# Patient Record
Sex: Female | Born: 1980 | Race: Asian | Hispanic: No | Marital: Married | Smoking: Never smoker
Health system: Southern US, Community
[De-identification: ages and names within clinical notes are randomized; demographics above are authoritative.]

## PROBLEM LIST (undated history)

## (undated) DIAGNOSIS — T7840XA Allergy, unspecified, initial encounter: Secondary | ICD-10-CM

## (undated) HISTORY — DX: Allergy, unspecified, initial encounter: T78.40XA

---

## 2012-01-27 LAB — OB RESULTS CONSOLE RPR: RPR: NONREACTIVE

## 2012-01-27 LAB — OB RESULTS CONSOLE ABO/RH: RH Type: POSITIVE

## 2012-01-27 LAB — OB RESULTS CONSOLE HIV ANTIBODY (ROUTINE TESTING): HIV: NONREACTIVE

## 2012-01-27 LAB — OB RESULTS CONSOLE HEPATITIS B SURFACE ANTIGEN: Hepatitis B Surface Ag: NEGATIVE

## 2012-02-18 NOTE — L&D Delivery Note (Signed)
Operative Delivery Note At 4:23 PM a viable and healthy female was delivered via Vaginal, Vacuum Investment banker, operational).  Presentation: vertex; Position: Occiput,, Anterior; Station: +3.  Verbal consent: obtained from patient.  Risks and benefits discussed in detail.  Risks include, but are not limited to the risks of anesthesia, bleeding, infection, damage to maternal tissues, fetal cephalhematoma.  There is also the risk of inability to effect vaginal delivery of the head, or shoulder dystocia that cannot be resolved by established maneuvers, leading to the need for emergency cesarean section.due to repetitive deep variable decel  APGAR: 7, 9; weight .  6lb Placenta status: Intact, Spontaneous. Not sent  Cord: 3 vessels with the following complications: Short.  Cord pH: none  Anesthesia: Epidural  Instruments: mushroom vacuum Episiotomy: None Lacerations: 2nd degree;Perineal Suture Repair: 3.0 chromic Est. Blood Loss (mL): 250  Mom to postpartum.  Baby to nursery-stable.  Yasaman Kolek A 08/31/2012, 5:04 PM

## 2012-04-19 ENCOUNTER — Ambulatory Visit (INDEPENDENT_AMBULATORY_CARE_PROVIDER_SITE_OTHER): Payer: 59 | Admitting: Internal Medicine

## 2012-04-19 VITALS — BP 112/72 | HR 81 | Temp 99.3°F | Resp 16 | Ht 60.5 in | Wt 131.0 lb

## 2012-04-19 DIAGNOSIS — L309 Dermatitis, unspecified: Secondary | ICD-10-CM

## 2012-04-19 DIAGNOSIS — L259 Unspecified contact dermatitis, unspecified cause: Secondary | ICD-10-CM

## 2012-04-19 MED ORDER — FLUOCINONIDE-E 0.05 % EX CREA: TOPICAL_CREAM | Freq: Two times a day (BID) | CUTANEOUS | Status: DC

## 2012-04-19 NOTE — Progress Notes (Signed)
  Subjective:    Patient ID: Barbara Campbell, female    DOB: [birth year], 32 y.o.   MRN: [id number]  HPIrash hands and feet 3 weeks--comes out at night and itches No illn/no meds 5 mos preg--ob says not connected No hx skin probs New house 2-3 months     Review of Systems     Objective:   Physical Exam BP 112/72  Pulse 81  Temp(Src) 99.3 F (37.4 C) (Oral)  Resp 16  Ht 5' 0.5" (1.537 m)  Wt 131 lb (59.421 kg)  BMI 25.15 kg/m2  SpO2 98% NAD Skin with tiny papules on dorsum of feet and a little on dorsum of hands w/out webspace involvement No scales      Assessment & Plan:  Dry skin eczema Lotion Lidex 2 weeks then reck if not well

## 2012-05-22 ENCOUNTER — Ambulatory Visit (INDEPENDENT_AMBULATORY_CARE_PROVIDER_SITE_OTHER): Payer: 59 | Admitting: Internal Medicine

## 2012-05-22 VITALS — BP 102/70 | HR 86 | Temp 98.0°F | Resp 16 | Ht 60.0 in | Wt 130.0 lb

## 2012-05-22 DIAGNOSIS — J309 Allergic rhinitis, unspecified: Secondary | ICD-10-CM | POA: Insufficient documentation

## 2012-05-22 DIAGNOSIS — R05 Cough: Secondary | ICD-10-CM

## 2012-05-22 DIAGNOSIS — R059 Cough, unspecified: Secondary | ICD-10-CM

## 2012-05-22 MED ORDER — FLUTICASONE PROPIONATE 50 MCG/ACT NA SUSP: NASAL | Status: DC

## 2012-05-22 MED ORDER — PREDNISONE 20 MG PO TABS: ORAL_TABLET | ORAL | Status: DC

## 2012-05-22 NOTE — Progress Notes (Signed)
  Subjective:    Patient ID: Barbara Campbell, female    DOB: 13-Oct-1980, 32 y.o.   MRN: [id number]  HPI treated last week for a cough at another urgent care Center Was given amoxicillin and Hycodan but is not better Increased symptoms beginning Tuesday of discharge from eyes with irritation, increased runny nose, Cough, and wheezing at night. Mild increase in eczema which responds to Lidex-note past visit  6 months pregnant  Review of Systems No fever/cough nonproductive     Objective:   Physical Exam vital signs stable Injected conjunctiva Boggy nares with clear rhinorrhea No nodes Wheezing with forced expiration mild Lungs clear otherwise      Assessment & Plan:  AR (allergic rhinitis)  Cough secondary to reactive airway  Meds ordered this encounter  Medications  . fluticasone (FLONASE) 50 MCG/ACT nasal spray    Sig: 2 sprays in each nostril twice a day for 7 days and then 2 sprays in each nostril at bedtime for 2-3 months    Dispense:  16 g    Refill:  6  . predniSONE (DELTASONE) 20 MG tablet    Sig: 3/3/2/2/1/1 single daily dose for 6 days    Dispense:  12 tablet    Refill:  0  ----zyrtec is on her approved list from gynecology obstetrics

## 2012-05-24 ENCOUNTER — Emergency Department (HOSPITAL_COMMUNITY)
Admission: EM | Admit: 2012-05-24 | Discharge: 2012-05-24 | Disposition: A | Discharge status: Home / Self Care | Payer: Commercial Managed Care - PPO | Attending: Emergency Medicine | Admitting: Emergency Medicine

## 2012-05-24 ENCOUNTER — Encounter (HOSPITAL_COMMUNITY): Payer: Self-pay | Admitting: Emergency Medicine

## 2012-05-24 ENCOUNTER — Emergency Department (HOSPITAL_COMMUNITY): Payer: Commercial Managed Care - PPO

## 2012-05-24 DIAGNOSIS — IMO0002 Reserved for concepts with insufficient information to code with codable children: Secondary | ICD-10-CM | POA: Insufficient documentation

## 2012-05-24 DIAGNOSIS — N23 Unspecified renal colic: Secondary | ICD-10-CM

## 2012-05-24 DIAGNOSIS — R109 Unspecified abdominal pain: Secondary | ICD-10-CM | POA: Insufficient documentation

## 2012-05-24 DIAGNOSIS — R110 Nausea: Secondary | ICD-10-CM | POA: Insufficient documentation

## 2012-05-24 DIAGNOSIS — O9989 Other specified diseases and conditions complicating pregnancy, childbirth and the puerperium: Secondary | ICD-10-CM | POA: Insufficient documentation

## 2012-05-24 LAB — URINE MICROSCOPIC-ADD ON

## 2012-05-24 LAB — URINALYSIS, ROUTINE W REFLEX MICROSCOPIC
Glucose, UA: NEGATIVE mg/dL
Ketones, ur: NEGATIVE mg/dL
Protein, ur: NEGATIVE mg/dL
Urobilinogen, UA: 0.2 mg/dL (ref 0.0–1.0)

## 2012-05-24 MED ORDER — ONDANSETRON HCL 4 MG PO TABS: 4.0000 mg | ORAL_TABLET | Freq: Four times a day (QID) | ORAL | Status: DC

## 2012-05-24 MED ORDER — OXYCODONE-ACETAMINOPHEN 5-325 MG PO TABS: 1.0000 | ORAL_TABLET | Freq: Four times a day (QID) | ORAL | Status: DC | PRN

## 2012-05-24 MED ORDER — ONDANSETRON HCL 4 MG/2ML IJ SOLN
4.0000 mg | Freq: Once | INTRAMUSCULAR | Status: AC
  Administered 2012-05-24: 4 mg via INTRAVENOUS
  Filled 2012-05-24: qty 2

## 2012-05-24 MED ORDER — HYDROMORPHONE HCL PF 1 MG/ML IJ SOLN
1.0000 mg | Freq: Once | INTRAMUSCULAR | Status: AC
  Administered 2012-05-24: 0.5 mg via INTRAVENOUS
  Filled 2012-05-24: qty 1

## 2012-05-24 MED ORDER — SODIUM CHLORIDE 0.9 % IV SOLN
INTRAVENOUS | Status: DC
  Administered 2012-05-24: 05:00:00 via INTRAVENOUS

## 2012-05-24 NOTE — ED Provider Notes (Signed)
History     CSN: [id number]  Arrival date & time 05/24/12  0401   First MD Initiated Contact with Patient 05/24/12 [phone]      Chief Complaint  Patient presents with  . Flank Pain    (Consider location/radiation/quality/duration/timing/severity/associated sxs/prior treatment) HPI History provided by patient. Right flank pain onset around 1:30 this morning with associated nausea. Pain initially crampy now sharp and more severe. Started to radiate to right lower quadrant area. She has not noticed any blood or urine. She is currently [redacted] weeks pregnant G3 P0. She is followed by Dr. Cherly Hensen. No vaginal bleeding or LOF. No fevers. No dysuria. No history of kidney stones or similar symptoms in the past. No known alleviating factors for persistent pain. Past Medical History  Diagnosis Date  . Allergy     No past surgical history on file.  Family History  Problem Relation Age of Onset  . Hyperlipidemia Mother   . Anemia Mother   . Hypertension Father     History  Substance Use Topics  . Smoking status: Never Smoker   . Smokeless tobacco: Not on file  . Alcohol Use: No    OB History   Grav Para Term Preterm Abortions TAB SAB Ect Mult Living   2         0      Review of Systems  Constitutional: Negative for fever and chills.  HENT: Negative for neck pain and neck stiffness.   Eyes: Negative for pain.  Respiratory: Negative for shortness of breath.   Cardiovascular: Negative for chest pain.  Gastrointestinal: Negative for abdominal pain.  Genitourinary: Positive for flank pain. Negative for dysuria.  Musculoskeletal: Negative for back pain.  Skin: Negative for rash.  Neurological: Negative for headaches.  All other systems reviewed and are negative.    Allergies  Sulfa antibiotics  Home Medications   Current Outpatient Rx  Name  Route  Sig  Dispense  Refill  . fluocinonide-emollient (LIDEX-E) 0.05 % cream   Topical   Apply topically 2 (two) times daily.   30 g  0   . fluticasone (FLONASE) 50 MCG/ACT nasal spray      2 sprays in each nostril twice a day for 7 days and then 2 sprays in each nostril at bedtime for 2-3 months   16 g   6   . ondansetron (ZOFRAN) 8 MG tablet   Oral   Take by mouth every 12 (twelve) hours as needed for nausea.         . predniSONE (DELTASONE) 20 MG tablet      3/3/2/2/1/1 single daily dose for 6 days   12 tablet   0   . Prenatal Vit-Fe Fumarate-FA (PRENATAL MULTIVITAMIN) TABS   Oral   Take 1 tablet by mouth daily at 12 noon.           BP 147/98  Pulse 82  Temp(Src) 97.7 F (36.5 C) (Oral)  Resp 18  Ht 4\' 11"  (1.499 m)  Wt 130 lb 6.4 oz (59.149 kg)  BMI 26.32 kg/m2  SpO2 97%  Physical Exam  Constitutional: She is oriented to person, place, and time. She appears well-developed and well-nourished.  HENT:  Head: Normocephalic and atraumatic.  Mouth/Throat: Oropharynx is clear and moist.  Eyes: EOM are normal. Pupils are equal, round, and reactive to light. No scleral icterus.  Neck: Neck supple.  Cardiovascular: Normal rate, regular rhythm and intact distal pulses.   Pulmonary/Chest: Effort normal and breath sounds normal.  No respiratory distress.  Abdominal:  Gravid consistent with dates. No abdominal tenderness, guarding or rebound. No CVA tenderness. Localizes discomfort to right flank. No tenderness over McBurney's point. Negative Murphy sign  Musculoskeletal: Normal range of motion. She exhibits no edema.  Neurological: She is alert and oriented to person, place, and time.  Skin: Skin is warm and dry.    ED Course  Procedures (including critical care time)  Labs Reviewed  URINALYSIS, ROUTINE W REFLEX MICROSCOPIC - Abnormal; Notable for the following:    Hgb urine dipstick LARGE (*)    All other components within normal limits  URINE MICROSCOPIC-ADD ON   US Abdomen Complete  05/24/2012  *RADIOLOGY REPORT*  Clinical Data:  Right flank pain.  ABDOMINAL ULTRASOUND COMPLETE  Comparison:   None  Findings:  Gallbladder:  The gallbladder is normal in appearance, without evidence for gallstones, significant gallbladder wall thickening or pericholecystic fluid.  No ultrasonographic Murphy's sign is elicited.  Common Bile Duct:  0.5 cm in diameter; within normal limits in caliber.  Liver:  Normal parenchymal echogenicity and echotexture; no focal lesions identified.  Limited Doppler evaluation demonstrates normal blood flow within the liver.  IVC:  Unremarkable in appearance.  Pancreas:  Although the pancreas is difficult to visualize in its entirety due to overlying bowel gas, no focal pancreatic abnormality is identified.  Spleen:  8.1 cm in length; within normal limits in size and echotexture.  Right kidney:  11.1 cm in length; normal in size, configuration and parenchymal echogenicity.  No evidence of mass.  There is prominence of the right renal pelvis, which may reflect mild pelvicaliectasis or slight hydronephrosis.  Left kidney:  11.0 cm in length; normal in size, configuration and parenchymal echogenicity.  No evidence of mass or hydronephrosis.  Abdominal Aorta:  Normal in caliber; no aneurysm identified.  Bilateral ureteral jets are noted at the bladder, slightly less prominent on the right.  IMPRESSION:  1.  Prominence of the right renal pelvis, which may reflect mild pelvicaliectasis or slight hydronephrosis.  Bilateral ureteral jets are visualized, though mildly less prominent on the right.  This could reflect slight hydronephrosis of pregnancy. 2.  Otherwise unremarkable abdominal ultrasound.   Original Report Authenticated By: Tonia Ghent, M.D.    IV Dilaudid and IV fluids. On recheck pain significantly improved. Patient is ambulating to the bathroom no acute distress.  Rapid response nurse present, fetal heart tones 150s with no contractions on fetal monitoring.   6:18 AM discussed with OB/GYN, Dr Billy Coast, on call, recs OK for narcotics and f/u DR Cherly Hensen.   MDM   Right flank  pain with hematuria and prominent right renal pelvis on ultrasound, suspect ureterolithiasis. Serial abdominal exams without tenderness or peritonitis and unlikely acute appendicitis  Reliable historian and agrees to strict return precautions       Sunnie Nielsen, MD 05/24/12 [phone]

## 2012-05-24 NOTE — ED Notes (Signed)
Pt c/o Right flank pain. Pt states pain started around 0130 this morning, patient has nausea.

## 2012-05-24 NOTE — Progress Notes (Signed)
Pt is a G2P0  (EDC 7/18) at 25wk 3da. 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  States she has had urinary frequency since 4/6 in the morning; with R flank pain starting 4/7 @ 0130. She denies leaking of fluid or vaginal bleeding.  FHT 150, minimal/moderate variability, w/10x10 accelerations and no decelerations.

## 2012-06-30 ENCOUNTER — Inpatient Hospital Stay (HOSPITAL_COMMUNITY): Admission: AD | Admit: 2012-06-30 | Payer: Self-pay | Source: Ambulatory Visit | Admitting: Obstetrics and Gynecology

## 2012-08-31 ENCOUNTER — Inpatient Hospital Stay (HOSPITAL_COMMUNITY)
Admission: AD | Admit: 2012-08-31 | Discharge: 2012-09-02 | DRG: 775 | Disposition: A | Discharge status: Home / Self Care | Payer: Commercial Managed Care - PPO | Source: Ambulatory Visit | Attending: Obstetrics and Gynecology | Admitting: Obstetrics and Gynecology

## 2012-08-31 ENCOUNTER — Encounter (HOSPITAL_COMMUNITY): Payer: Self-pay | Admitting: Anesthesiology

## 2012-08-31 ENCOUNTER — Inpatient Hospital Stay (HOSPITAL_COMMUNITY): Payer: Commercial Managed Care - PPO | Admitting: Anesthesiology

## 2012-08-31 ENCOUNTER — Encounter (HOSPITAL_COMMUNITY): Payer: Self-pay | Admitting: *Deleted

## 2012-08-31 DIAGNOSIS — J309 Allergic rhinitis, unspecified: Secondary | ICD-10-CM

## 2012-08-31 DIAGNOSIS — K5900 Constipation, unspecified: Secondary | ICD-10-CM | POA: Diagnosis not present

## 2012-08-31 DIAGNOSIS — O9903 Anemia complicating the puerperium: Secondary | ICD-10-CM | POA: Diagnosis not present

## 2012-08-31 DIAGNOSIS — D62 Acute posthemorrhagic anemia: Secondary | ICD-10-CM | POA: Diagnosis not present

## 2012-08-31 LAB — ABO/RH: ABO/RH(D): O POS

## 2012-08-31 LAB — CBC
MCH: 29 pg (ref 26.0–34.0)
MCV: 86.5 fL (ref 78.0–100.0)
Platelets: 192 10*3/uL (ref 150–400)
RDW: 15.3 % (ref 11.5–15.5)
WBC: 9.7 10*3/uL (ref 4.0–10.5)

## 2012-08-31 MED ORDER — OXYTOCIN 40 UNITS IN LACTATED RINGERS INFUSION - SIMPLE MED
62.5000 mL/h | INTRAVENOUS | Status: DC
  Administered 2012-08-31: 999 mL/h via INTRAVENOUS
  Filled 2012-08-31: qty 1000

## 2012-08-31 MED ORDER — EPHEDRINE 5 MG/ML INJ
10.0000 mg | INTRAVENOUS | Status: DC | PRN
  Filled 2012-08-31: qty 4

## 2012-08-31 MED ORDER — SENNOSIDES-DOCUSATE SODIUM 8.6-50 MG PO TABS
2.0000 | ORAL_TABLET | Freq: Every day | ORAL | Status: DC
  Administered 2012-08-31 – 2012-09-01 (×2): 2 via ORAL

## 2012-08-31 MED ORDER — IBUPROFEN 600 MG PO TABS: 600.0000 mg | ORAL_TABLET | Freq: Four times a day (QID) | ORAL | Status: DC | PRN

## 2012-08-31 MED ORDER — PHENYLEPHRINE 40 MCG/ML (10ML) SYRINGE FOR IV PUSH (FOR BLOOD PRESSURE SUPPORT): 80.0000 ug | PREFILLED_SYRINGE | INTRAVENOUS | Status: DC | PRN

## 2012-08-31 MED ORDER — EPHEDRINE 5 MG/ML INJ: 10.0000 mg | INTRAVENOUS | Status: DC | PRN

## 2012-08-31 MED ORDER — PHENYLEPHRINE 40 MCG/ML (10ML) SYRINGE FOR IV PUSH (FOR BLOOD PRESSURE SUPPORT)
80.0000 ug | PREFILLED_SYRINGE | INTRAVENOUS | Status: DC | PRN
  Filled 2012-08-31: qty 5

## 2012-08-31 MED ORDER — ONDANSETRON HCL 4 MG/2ML IJ SOLN: 4.0000 mg | Freq: Four times a day (QID) | INTRAMUSCULAR | Status: DC | PRN

## 2012-08-31 MED ORDER — LACTATED RINGERS IV SOLN: 500.0000 mL | INTRAVENOUS | Status: DC | PRN

## 2012-08-31 MED ORDER — LIDOCAINE HCL (PF) 1 % IJ SOLN
INTRAMUSCULAR | Status: DC | PRN
  Administered 2012-08-31 (×2): 9 mL

## 2012-08-31 MED ORDER — CITRIC ACID-SODIUM CITRATE 334-500 MG/5ML PO SOLN: 30.0000 mL | ORAL | Status: DC | PRN

## 2012-08-31 MED ORDER — TERBUTALINE SULFATE 1 MG/ML IJ SOLN: 0.2500 mg | Freq: Once | INTRAMUSCULAR | Status: DC | PRN

## 2012-08-31 MED ORDER — PRENATAL MULTIVITAMIN CH
1.0000 | ORAL_TABLET | Freq: Every day | ORAL | Status: DC
  Administered 2012-09-01 – 2012-09-02 (×2): 1 via ORAL
  Filled 2012-08-31 (×2): qty 1

## 2012-08-31 MED ORDER — OXYCODONE-ACETAMINOPHEN 5-325 MG PO TABS: 1.0000 | ORAL_TABLET | ORAL | Status: DC | PRN

## 2012-08-31 MED ORDER — WITCH HAZEL-GLYCERIN EX PADS
1.0000 "application " | MEDICATED_PAD | CUTANEOUS | Status: DC | PRN
  Administered 2012-09-01: 1 via TOPICAL

## 2012-08-31 MED ORDER — ONDANSETRON HCL 4 MG/2ML IJ SOLN: 4.0000 mg | INTRAMUSCULAR | Status: DC | PRN

## 2012-08-31 MED ORDER — BENZOCAINE-MENTHOL 20-0.5 % EX AERO
1.0000 "application " | INHALATION_SPRAY | CUTANEOUS | Status: DC | PRN
  Administered 2012-08-31: 1 via TOPICAL
  Filled 2012-08-31 (×2): qty 56

## 2012-08-31 MED ORDER — LANOLIN HYDROUS EX OINT: TOPICAL_OINTMENT | CUTANEOUS | Status: DC | PRN

## 2012-08-31 MED ORDER — LIDOCAINE HCL (PF) 1 % IJ SOLN
30.0000 mL | INTRAMUSCULAR | Status: AC | PRN
  Administered 2012-08-31: 30 mL via SUBCUTANEOUS
  Filled 2012-08-31 (×2): qty 30

## 2012-08-31 MED ORDER — SIMETHICONE 80 MG PO CHEW: 80.0000 mg | CHEWABLE_TABLET | ORAL | Status: DC | PRN

## 2012-08-31 MED ORDER — ONDANSETRON HCL 4 MG PO TABS: 4.0000 mg | ORAL_TABLET | ORAL | Status: DC | PRN

## 2012-08-31 MED ORDER — LACTATED RINGERS IV SOLN: 500.0000 mL | Freq: Once | INTRAVENOUS | Status: DC

## 2012-08-31 MED ORDER — ZOLPIDEM TARTRATE 5 MG PO TABS
5.0000 mg | ORAL_TABLET | Freq: Every evening | ORAL | Status: DC | PRN
Start: 2012-08-31 — End: 2012-09-02

## 2012-08-31 MED ORDER — DIPHENHYDRAMINE HCL 25 MG PO CAPS: 25.0000 mg | ORAL_CAPSULE | Freq: Four times a day (QID) | ORAL | Status: DC | PRN

## 2012-08-31 MED ORDER — OXYTOCIN 40 UNITS IN LACTATED RINGERS INFUSION - SIMPLE MED
1.0000 m[IU]/min | INTRAVENOUS | Status: DC
  Administered 2012-08-31: 1 m[IU]/min via INTRAVENOUS

## 2012-08-31 MED ORDER — DIBUCAINE 1 % RE OINT
1.0000 "application " | TOPICAL_OINTMENT | RECTAL | Status: DC | PRN
  Filled 2012-08-31: qty 28

## 2012-08-31 MED ORDER — FERROUS SULFATE 325 (65 FE) MG PO TABS
325.0000 mg | ORAL_TABLET | Freq: Two times a day (BID) | ORAL | Status: DC
  Administered 2012-09-01 – 2012-09-02 (×4): 325 mg via ORAL
  Filled 2012-08-31 (×5): qty 1

## 2012-08-31 MED ORDER — IBUPROFEN 600 MG PO TABS
600.0000 mg | ORAL_TABLET | Freq: Four times a day (QID) | ORAL | Status: DC
  Administered 2012-08-31 – 2012-09-02 (×7): 600 mg via ORAL
  Filled 2012-08-31 (×7): qty 1

## 2012-08-31 MED ORDER — FENTANYL 2.5 MCG/ML BUPIVACAINE 1/10 % EPIDURAL INFUSION (WH - ANES)
INTRAMUSCULAR | Status: DC | PRN
  Administered 2012-08-31: 16:00:00
  Administered 2012-08-31: 14 mL/h via EPIDURAL

## 2012-08-31 MED ORDER — DIPHENHYDRAMINE HCL 50 MG/ML IJ SOLN: 12.5000 mg | INTRAMUSCULAR | Status: DC | PRN

## 2012-08-31 MED ORDER — FENTANYL 2.5 MCG/ML BUPIVACAINE 1/10 % EPIDURAL INFUSION (WH - ANES)
14.0000 mL/h | INTRAMUSCULAR | Status: DC | PRN
  Filled 2012-08-31 (×2): qty 125

## 2012-08-31 MED ORDER — LACTATED RINGERS IV SOLN
INTRAVENOUS | Status: DC
  Administered 2012-08-31: 08:00:00 via INTRAVENOUS

## 2012-08-31 MED ORDER — ACETAMINOPHEN 325 MG PO TABS: 650.0000 mg | ORAL_TABLET | ORAL | Status: DC | PRN

## 2012-08-31 MED ORDER — FLEET ENEMA 7-19 GM/118ML RE ENEM: 1.0000 | ENEMA | RECTAL | Status: DC | PRN

## 2012-08-31 MED ORDER — OXYTOCIN BOLUS FROM INFUSION: 500.0000 mL | INTRAVENOUS | Status: DC

## 2012-08-31 NOTE — Progress Notes (Signed)
Pt vomiting.  Cool cloths to forehead.

## 2012-08-31 NOTE — MAU Note (Signed)
PT ARRIVES HURTING-  IN OFFICE  YESTERDAY- 1 CM.    SAYS SROM  AT 0530.   DENIES  HX HSV,  PT SAYS HAD MRSA IN 2003- SORE ON THIGH-    CHART NOT FLAGGED

## 2012-08-31 NOTE — Progress Notes (Signed)
S: still does not note rectal pressure  O: VE fully (+) +1/2 station Tracing: baseline 130's good variability ? Ctx freq ( q 4 mins per RN)  IMP: Complete.  P) resume pitocin Labor VTx down

## 2012-08-31 NOTE — H&P (Signed)
Chinwe Lope is a 32 y.o. female presenting G2P0 at 39.4 wks, with ROM since 5.30 am and uterine contractions in active labor. Desires epidural. PNCare - Dr Cherly Hensen. Low maternal weight gain but god fetal growth sono in 3rd trim. Labs ad sono nl, GBS neg. No GHTN or CHTN hx. Hair loss in last few yrs, but nl ferritin and TSH.   Maternal Medical History:  Reason for admission: Rupture of membranes and contractions.     OB History   Grav Para Term Preterm Abortions TAB SAB Ect Mult Living   2    1 1     0     Past Medical History  Diagnosis Date  . Allergy    History reviewed. No pertinent past surgical history. Family History: family history includes Anemia in her mother; Hyperlipidemia in her mother; and Hypertension in her father. Social History:  reports that she has never smoked. She does not have any smokeless tobacco history on file. She reports that she does not drink alcohol or use illicit drugs.   Prenatal Transfer Tool  Maternal Diabetes: No Genetic Screening: Normal AFP4 nl. Maternal Ultrasounds/Referrals: Normal Fetal Ultrasounds or other Referrals:  None Maternal Substance Abuse:  No Significant Maternal Medications:  None Significant Maternal Lab Results:  Lab values include: Group B Strep negative Other Comments:  Poor maternal wt gain but normal fetal growth on sono, EFW 7 lbs at 38 wks.   ROS no HA/ vision changes/ CP/ SOB/ swelling  Dilation: 3.5 Effacement (%): 90 Station: -1 Exam by:: N Psychologist, counselling Blood pressure 135/101, pulse 71, temperature 98 F (36.7 C), temperature source Oral, resp. rate 22. Exam Physical Exam  A&O x 3, no acute distress. Pleasant HEENT neg, no thyromegaly Lungs CTA bilat CV RRR, S1S2 normal Abdo soft, non tender, non acute Extr no edema/ tenderness Pelvic as above per RN FHT 130s/ + accels/ no decels/ mod variab- category I Toco q 2-3 min, spontaneous labor  Prenatal labs: ABO, Rh: O/Positive/-- (12/10 0000) Antibody:  Negative (12/10 0000) Rubella: Immune (12/10 0000) RPR: Nonreactive (12/10 0000)  HBsAg: Negative (12/10 0000)  HIV: Non-reactive (12/10 0000)  GBS: Negative (06/19 0000)  Glucola nl Anatomy sono normal Genetic screening- QUAD nl  Assessment/Plan: 32 yo, healthy female, G2P0 at 39.4 wks. Poor maternal weight gain, otherwise uncomplicated pregnancy. Patient in active labor with ROM at 5.30 am, GBS neg. Admit, epidural ok, limit pelvic exams. FHT category I  Laretta Pyatt R 08/31/2012, 7:56 AM

## 2012-08-31 NOTE — Anesthesia Preprocedure Evaluation (Signed)

## 2012-08-31 NOTE — Progress Notes (Signed)
Late note- Saw pt at 10.45 am after comfortable with Epidural. VS stable. FHT 140s/ no decels, category I. UCs spaced out to q 5 min. SVE 4.5/80%/0/ OT. Turn to left lat, start low dose pitocin at 1 and up by 1 for q 3 min UCs. EFW 7 lbs, pelvic narrow subpubic arch, await descent.   Husband concerned since Dr Cherly Hensen is on vacation. Pt and husband reassured, discussed call coverage by other MDs from practice, pt understands and is fine.  V.Rhiann Boucher, MD

## 2012-08-31 NOTE — Progress Notes (Signed)
Barbara Campbell is a 32 y.o. G2P0010 at [redacted]w[redacted]d by LMP admitted for rupture of membranes  Subjective: No chief complaint on file. no ctx or pressure noted Epidural Pitocin 4 MIU  Objective: BP 118/82  Pulse 79  Temp(Src) 98.4 F (36.9 C) (Oral)  Resp 18  SpO2 99%      FHT:  FHR: 130 bpm, variability: moderate,  accelerations:  Present,  decelerations:  Present fhr decle down to 80's to 90 after VE and turning pt to right side. scalp stim positional changes done UC:   irregular, every ? 2-3 minutes SVE:   8/100/0 / +1 with palp cervix on right  Labs: Lab Results  Component Value Date   WBC 9.7 08/31/2012   HGB 13.1 08/31/2012   HCT 39.0 08/31/2012   MCV 86.5 08/31/2012   PLT 192 08/31/2012    Assessment / Plan: Spontaneous labor, progressing normally Reassuring fetal tracing now P) pitocin off. ISE placed. Watch tracing closely  Anticipated MOD:  NSVD  Barbara Campbell A 08/31/2012, 1:13 PM

## 2012-09-01 ENCOUNTER — Encounter (HOSPITAL_COMMUNITY): Payer: Self-pay | Admitting: *Deleted

## 2012-09-01 LAB — CBC
Hemoglobin: 10.7 g/dL — ABNORMAL LOW (ref 12.0–15.0)
MCH: 28.8 pg (ref 26.0–34.0)
MCHC: 33.2 g/dL (ref 30.0–36.0)
Platelets: 202 10*3/uL (ref 150–400)
RDW: 15.5 % (ref 11.5–15.5)

## 2012-09-01 LAB — CCBB MATERNAL DONOR DRAW

## 2012-09-01 MED ORDER — MAGNESIUM OXIDE 400 (241.3 MG) MG PO TABS
200.0000 mg | ORAL_TABLET | Freq: Every day | ORAL | Status: DC
  Administered 2012-09-01 – 2012-09-02 (×2): 200 mg via ORAL
  Filled 2012-09-01 (×3): qty 0.5

## 2012-09-01 NOTE — Anesthesia Postprocedure Evaluation (Signed)
Anesthesia Post Note  Patient: Barbara Campbell  Procedure(s) Performed: * No procedures listed *  Anesthesia type: Epidural  Patient location: Mother/Baby  Post pain: Pain level controlled  Post assessment: Post-op Vital signs reviewed  Last Vitals:  Filed Vitals:   09/01/12 0835  BP: 109/69  Pulse: 89  Temp: 36.7 C  Resp: 18    Post vital signs: Reviewed  Level of consciousness:alert  Complications: No apparent anesthesia complications

## 2012-09-01 NOTE — Progress Notes (Signed)
Patient ID: Barbara Campbell, female   DOB: November 19, 1980, 32 y.o.   MRN: [id number] PPD # 1 SVD  S:  Reports feeling well             Tolerating po/ No nausea or vomiting             Bleeding is light             Pain controlled with ibuprofen (OTC)             Up ad lib / ambulatory / voiding without difficulties / flatus present, no BM - needs med stronger than stool softner   Newborn  Information for the patient's newborn:  Boullion, Girl Juelle [[id number]]  female  breast feeding    O:  A & O x 3, in no apparent distress              VS:  Filed Vitals:   08/31/12 2345 09/01/12 0619 09/01/12 0835 09/01/12 1802  BP: 126/86 135/87 109/69 120/75  Pulse: 88 80 89 77  Temp: 98.5 F (36.9 C) 98.5 F (36.9 C) 98.1 F (36.7 C) 98.5 F (36.9 C)  TempSrc: Oral Oral Oral Oral  Resp: 18 18 18 18   SpO2: 99%  98%     LABS:  Recent Labs  08/31/12 0725 09/01/12 0555  WBC 9.7 16.5*  HGB 13.1 10.7*  HCT 39.0 32.5*  PLT 192 202    Blood type: --/--/O POS (07/15 0725)  Rubella: Immune (12/10 0000)     Lungs: Clear and unlabored  Heart: regular rate and rhythm / no murmurs  Abdomen: soft, non-tender, non-distended, normal bowel sounds             Fundus: firm, non-tender, @ U  Perineum: intact, mild edema  Lochia: light  Extremities: no edema, no calf pain or tenderness, no Homans    A/P: PPD # 1  32 y.o., [id number]   Active Problems:   Postpartum care following vavd (7/15)  Constipation   Doing well - stable status  Routine post partum orders  Magnesium 200 mg daily  Anticipate discharge tomorrow    Barbara Campbell, M, MSN, CNM 09/01/2012, 12:18 PM

## 2012-09-02 MED ORDER — IBUPROFEN 600 MG PO TABS: 600.0000 mg | ORAL_TABLET | Freq: Four times a day (QID) | ORAL | Status: DC | PRN

## 2012-09-02 MED ORDER — FERROUS SULFATE 325 (65 FE) MG PO TABS: 325.0000 mg | ORAL_TABLET | Freq: Two times a day (BID) | ORAL | Status: DC

## 2012-09-02 NOTE — Progress Notes (Addendum)
PPD # 2 S/p VAVD with 2nd deg lac repair Subjective: Pt reports feeling well/ Pain controlled with ibuprofen (OTC) Tolerating po/ Voiding without problems/ No n/v Bleeding is light/ Newborn info:  Information for the patient's newborn:  Leu, Girl Dalisha [[id number]]  female Feeding: breast    Objective:  VS: Blood pressure 114/78, pulse 80, temperature 98.3 F (36.8 C), temperature source Oral, resp. rate 18, SpO2 98.00%, unknown if currently breastfeeding.    Recent Labs  08/31/12 0725 09/01/12 0555  WBC 9.7 16.5*  HGB 13.1 10.7*  HCT 39.0 32.5*  PLT 192 202    Blood type: --/--/O POS (07/15 0725) Rubella: Immune (12/10 0000)    Physical Exam:  General: A & O x 3  alert, cooperative and no distress CV: Regular rate and rhythm Resp: clear Abdomen: soft, nontender, normal bowel sounds Uterine Fundus: firm, below umbilicus, nontender Perineum: healing well with some edema labia minora and majora, extending to rectum Lochia: minimal Ext: extremities normal, atraumatic, no cyanosis or edema and Homans sign is negative, no sign of DVT    A/P: PPD # 2/ G2P1011/ S/P: vacuum extraction vaginal delivery with second deg. Lac. Doing well and stable for discharge home  RX: Ibuprofen 600mg  po Q 6 hrs prn pain #30 Refill x 1 Niferex 150mg  po QD/BID #30/#60 Refill x 1 Magnesium OTC PRN for constipation  WOB/GYN booklet given Routine pp visit in 6wks  Baby to stay until Saturday for fevers and mom will room in    Bruna Potter, FNP Student 09/02/2012, 9:43 AM   Reviewed: Arlana Lindau, MSN, [facility] 09/02/12 10:00

## 2012-09-02 NOTE — Discharge Summary (Addendum)
Obstetric Discharge Summary Barbara Campbell is a 32 y.o. female presenting G2P0 0 1 0 at 39.4 wks, with ROM and uterine contractions in active labor   Reason for Admission: onset of labor and rupture of membranes Prenatal Procedures: none Intrapartum Procedures: vacuum Postpartum Procedures: none Complications-Operative and Postpartum: 2nd degree perineal laceration Hemoglobin  Date Value Range Status  09/01/2012 10.7* 12.0 - 15.0 g/dL Final     DELTA CHECK NOTED     REPEATED TO VERIFY     HCT  Date Value Range Status  09/01/2012 32.5* 36.0 - 46.0 % Final    Physical Exam:  General: alert, cooperative and no distress Lochia: appropriate Uterine Fundus: firm DVT Evaluation: No evidence of DVT seen on physical exam. Negative Homan's sign.  Discharge Diagnoses:  Vacuum assisted vaginal delivery with 2nd degree tear Mild ABL anemia   Discharge Information: Date: 09/02/2012 Activity: pelvic rest Diet: routine Medications: Ibuprofen and Iron Condition: stable Instructions: refer to practice specific booklet Discharge to: home Follow-up Information   Follow up with COUSINS,SHERONETTE A, MD In 6 weeks.   Contact information:   [address] [place] [address] [phone]       Newborn Data: Live born female on 08/31/12 Birth Weight: 6 lb 0.8 oz (2745 g) APGAR: 7, 9  Home with mother on Saturday after room in stay for low grade fevers once cultures return.   Leward Quan, RN, FNP Student 09/02/2012, 9:43 AM  Reviewed: Arlana Lindau, MSN, [facility] 09/02/12 1000

## 2012-09-06 ENCOUNTER — Ambulatory Visit (HOSPITAL_COMMUNITY)
Admission: RE | Admit: 2012-09-06 | Discharge: 2012-09-06 | Disposition: A | Discharge status: Home / Self Care | Payer: Commercial Managed Care - PPO | Source: Ambulatory Visit | Attending: Obstetrics and Gynecology | Admitting: Obstetrics and Gynecology

## 2012-09-06 NOTE — Lactation Note (Signed)
Adult Lactation Consultation Outpatient Visit Note  Patient Name: Barbara Campbell                            Baby Girl Barbara Campbell,  DOB [birth year]  Birth weight  6 lb. 0 oz. Now 71 days old. Weight at [facility] yesterday, 09/05/12 was 5 lb. 13 oz.                               Date of Birth: 1980/09/10 Gestational Age at Delivery: [redacted]w[redacted]d Type of Delivery: SVB  Breastfeeding History: Frequency of Breastfeeding: every 2-3 hours Length of Feeding: 5-15 minutes, usually both breasts Voids: 2-3 without stool, Mom reports some urine with stools as well Stools: 5 yellow/orange stools per day  Supplementing / Method: Pumping:  Type of Pump:  Medela Pump N Style   Frequency:  Only with engorgement Friday and Saturday  Volume:  1 oz from each breast  Comments: Mom is here for feeding assessment. Mom reports sore nipples that are slowly getting better. Mom reports the baby is sleepy at the breast with some feedings. She is using Lanolin and wearing comfort gels for comfort. Mom reports her breasts became engorged on Friday, reporting the baby could not latch so she supplemented with Similac via bottle and slow flow nipple. The baby was taking 1 oz of formula each feeding. Her engorgement resolved on Sunday and she has been putting the baby back to the breast since Sunday. She has been using the cross cradle hold with breastfeeding.    Consultation Evaluation: On exam, her right nipple has no trauma, her left nipple has a bruised ridge at the top. Her nipples are asymmetrical as the bottom of the nipple. The bottom has a smaller nipple shaft than the top. No cracking or bleeding observed during this visit. When Mom was latching her baby, she was sitting forward and the baby was not supported well. Assisted Mom with using the breast friend pillow and cross cradle to support breast and baby. Assisted Mom to latch her baby with a asymmetrical latch, with chin tucked in well to the breast. Demonstrated how to bring bottom  lip down for wider mouth. Mom reported less discomfort. Baby does become sleepy during breast feeding. Demonstrated to mom how to keep baby awake at the breast, encouraged Mom to massage her breast to empty her breast better. Some nodules palpable at the bottom area of her breasts. These areas softened somewhat with massage and the baby breastfeeding. Baby demonstrated a good rhythmic suck with swallows when at the breast, just needs stimulation to keep awake at the breast for greater than 5 minutes.   Initial Feeding Assessment: Pre-feed Weight:  5 lb. 12.3 oz/2616 gm Post-feed Weight:  5 lb. 12.7 oz/2630 gm Amount Transferred:  14 ml Comments:  From left breast in cross cradle  Additional Feeding Assessment: Pre-feed Weight:  5 lb. 12.7 oz/2630 gm Post-feed Weight:   5 lb. 13.5 oz/2652 gm Amount Transferred:   22 ml Comments:  From right breast in football hold  Additional Feeding Assessment: Pre-feed Weight:   5 lb. 13.5 oz/2652 gm Post-feed Weight:  5 lb. 13.7 oz/2656 gm Amount Transferred:  4 ml Comments:  From left breast in football hold  Total Breast milk Transferred this Visit: 40 ml Total Supplement Given:   12 ml of Similac  Additional Interventions: Advised Mom since the baby  is sleepy at the breast transferring 40 ml at this visit, and would like baby to be transferring 45-60 ml with each feeding, to post pump during the day 4-6 times for 15 minutes to protect milk supply.  Advised to Breast feed every 2-3 hours or whenever the baby appears hungry if more frequent than that. Keep baby awake at the breast for 15-20 minutes each breast. Listen for swallows. Support baby well. Alternate positions from cross cradle to football to help empty breast well and improve nipple soreness. A new pair of comfort gels given today. Care for sore nipples reviewed.  Supplement with 15-20 ml of EBM or formula after each feeding till baby is transferring milk better.  Massage breasts during  feeding/pumping to soften any nodules and empty breast well. Monitor void/stools. Advised Mom baby should have 6 wet diaper every day now.   Follow-Up OP appointment scheduled for Tuesday, 09/14/12 at 1:00pm.     Alfred Levins 09/06/2012, 10:25 AM

## 2012-09-14 ENCOUNTER — Ambulatory Visit (HOSPITAL_COMMUNITY): Payer: Commercial Managed Care - PPO

## 2013-12-19 ENCOUNTER — Encounter (HOSPITAL_COMMUNITY): Payer: Self-pay | Admitting: *Deleted

## 2014-03-13 ENCOUNTER — Ambulatory Visit (INDEPENDENT_AMBULATORY_CARE_PROVIDER_SITE_OTHER): Payer: Commercial Managed Care - PPO | Admitting: Physician Assistant

## 2014-03-13 VITALS — BP 112/74 | HR 92 | Temp 98.5°F | Resp 16 | Ht 59.5 in | Wt 130.2 lb

## 2014-03-13 DIAGNOSIS — J0100 Acute maxillary sinusitis, unspecified: Secondary | ICD-10-CM

## 2014-03-13 MED ORDER — AMOXICILLIN 875 MG PO TABS: 1750.0000 mg | ORAL_TABLET | Freq: Two times a day (BID) | ORAL | Status: DC

## 2014-03-13 NOTE — Progress Notes (Signed)
Subjective:    Patient ID: Barbara Campbell, female    DOB: 01/27/1981, 34 y.o.   MRN: [id number]   PCP: No primary care provider on file.  Chief Complaint  Patient presents with  . Sinusitis    x 2 weeks   . Cough    Allergies  Allergen Reactions  . Sulfa Antibiotics Rash    Patient Active Problem List   Diagnosis Date Noted  . Postpartum care following vavd (7/15) 09/01/2012  . AR (allergic rhinitis) 05/22/2012    Prior to Admission medications   Medication Sig Start Date End Date Taking? Authorizing Provider  Prenatal Vit-Fe Fumarate-FA (PRENATAL MULTIVITAMIN) TABS Take 1 tablet by mouth daily at 12 noon.   Yes Historical Provider, MD  acetaminophen (TYLENOL) 500 MG tablet Take 500 mg by mouth every 6 (six) hours as needed for pain.    Historical Provider, MD    Medical, Surgical, Family and Social History reviewed and updated.  HPI  Patient presents with 2 weeks of sinus pressure and congestion. Progressively worsening. Her daughter had symptoms initially, but her symptoms have resolved. No fever or chills, but feels tired and now has facial pain, especially on the LEFT. Is [redacted] weeks pregnant. Has used acetaminophen and OTC Mucinex D.  No GI/GU symptoms. No unexplained muscle or joint pain. No fever/chills.  Review of Systems As above.    Objective:   Physical Exam  Constitutional: She is oriented to person, place, and time. Vital signs are normal. She appears well-developed and well-nourished. She is active and cooperative. No distress.  BP 112/74 mmHg  Pulse 92  Temp(Src) 98.5 F (36.9 C) (Oral)  Resp 16  Ht 4' 11.5" (1.511 m)  Wt 130 lb 3.2 oz (59.058 kg)  BMI 25.87 kg/m2  SpO2 99%  LMP 02/06/2014 (Approximate)  Breastfeeding? No   HENT:  Head: Normocephalic and atraumatic.  Right Ear: Hearing, tympanic membrane, external ear and ear canal normal.  Left Ear: Hearing, tympanic membrane, external ear and ear canal normal.  Nose: Mucosal edema and  rhinorrhea present.  No foreign bodies. Right sinus exhibits maxillary sinus tenderness and frontal sinus tenderness. Left sinus exhibits maxillary sinus tenderness and frontal sinus tenderness.  Mouth/Throat: Uvula is midline, oropharynx is clear and moist and mucous membranes are normal. No uvula swelling. No oropharyngeal exudate.  Eyes: Conjunctivae and EOM are normal. Pupils are equal, round, and reactive to light. Right eye exhibits no discharge. Left eye exhibits no discharge. No scleral icterus.  Neck: Trachea normal, normal range of motion and full passive range of motion without pain. Neck supple. No thyroid mass and no thyromegaly present.  Cardiovascular: Normal rate, regular rhythm and normal heart sounds.   Pulmonary/Chest: Effort normal and breath sounds normal.  Lymphadenopathy:       Head (right side): No submandibular, no tonsillar, no preauricular, no posterior auricular and no occipital adenopathy present.       Head (left side): No submandibular, no tonsillar, no preauricular and no occipital adenopathy present.    She has no cervical adenopathy.       Right: No supraclavicular adenopathy present.       Left: No supraclavicular adenopathy present.  Neurological: She is alert and oriented to person, place, and time. She has normal strength. No cranial nerve deficit or sensory deficit.  Skin: Skin is warm, dry and intact. No rash noted.  Psychiatric: She has a normal mood and affect. Her speech is normal and behavior is normal.  Assessment & Plan:  1. Acute maxillary sinusitis, recurrence not specified Rest, fluids. Anticipatory guidance. She has a list of OTC products approved by her OB, and she may use those as needed. RTC if symptoms worsen/persist. - amoxicillin (AMOXIL) 875 MG tablet; Take 2 tablets (1,750 mg total) by mouth 2 (two) times daily.  Dispense: 20 tablet; Refill: 0   Fernande Bras, PA-C Physician Assistant-Certified [facility]

## 2014-03-13 NOTE — Patient Instructions (Signed)
Get plenty of rest and drink at least 64 ounces of water daily. 

## 2014-07-25 LAB — OB RESULTS CONSOLE GC/CHLAMYDIA
Chlamydia: NEGATIVE
Gonorrhea: NEGATIVE

## 2014-07-25 LAB — OB RESULTS CONSOLE ABO/RH: RH TYPE: POSITIVE

## 2014-07-25 LAB — OB RESULTS CONSOLE RUBELLA ANTIBODY, IGM: RUBELLA: IMMUNE

## 2014-07-25 LAB — OB RESULTS CONSOLE PLATELET COUNT: PLATELETS: 338 10*3/uL

## 2014-07-25 LAB — OB RESULTS CONSOLE HIV ANTIBODY (ROUTINE TESTING): HIV: NONREACTIVE

## 2014-07-25 LAB — OB RESULTS CONSOLE HGB/HCT, BLOOD
HCT: 37 %
Hemoglobin: 12.8 g/dL

## 2014-07-25 LAB — OB RESULTS CONSOLE HEPATITIS B SURFACE ANTIGEN: Hepatitis B Surface Ag: NEGATIVE

## 2014-07-25 LAB — OB RESULTS CONSOLE ANTIBODY SCREEN: ANTIBODY SCREEN: NEGATIVE

## 2014-08-05 IMAGING — US US ABDOMEN COMPLETE
1 series · 13 of 25 positions shown · non-contrast
Comparison: None

CLINICAL DATA: Right flank pain.

ABDOMINAL ULTRASOUND COMPLETE

[Series 1: us abdomen complete · 0.30mm/px · 13 of 176 slices shown]
[im 1/176]
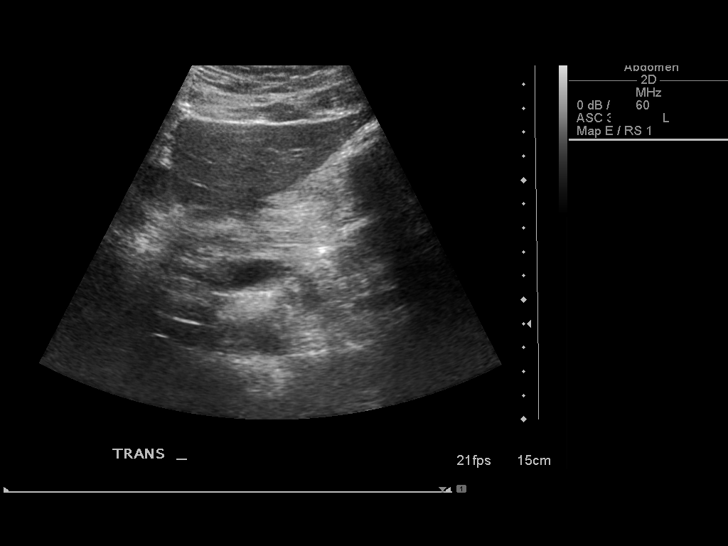
[im 15/176]
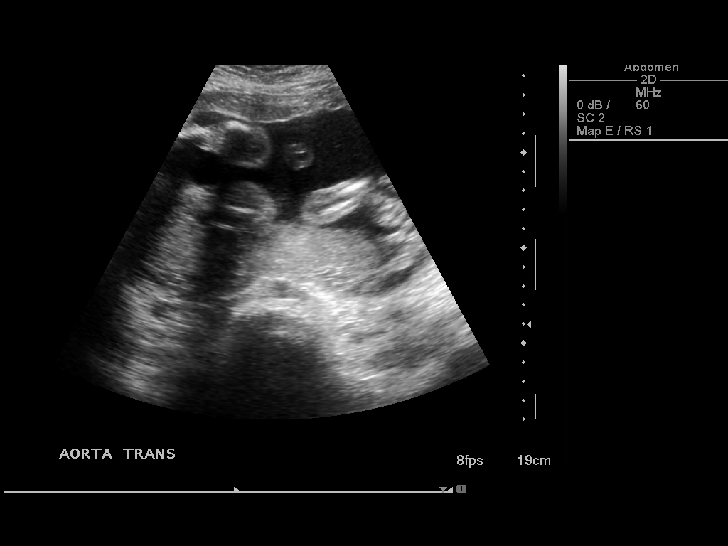
[im 30/176]
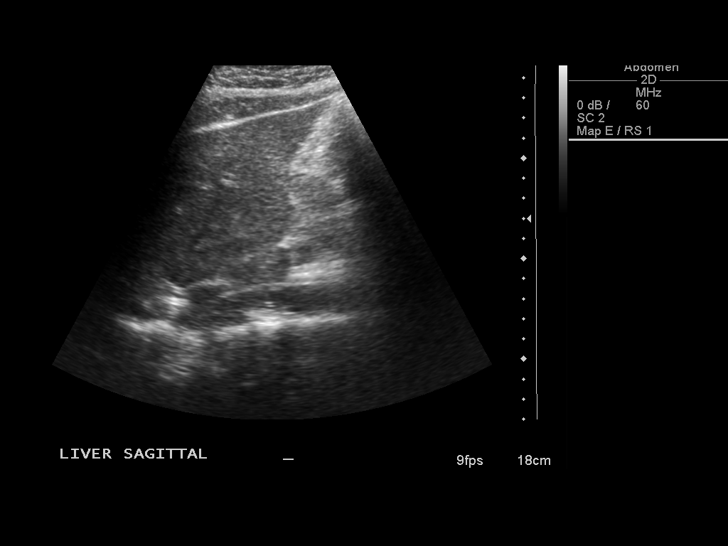
[im 44/176]
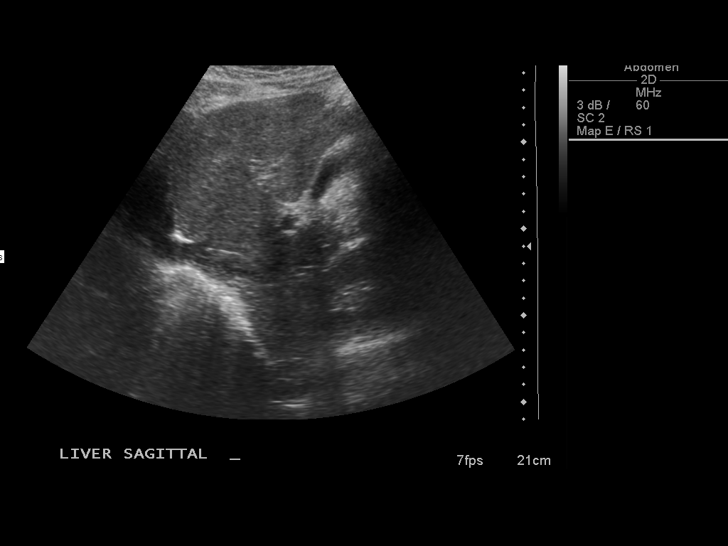
[im 59/176]
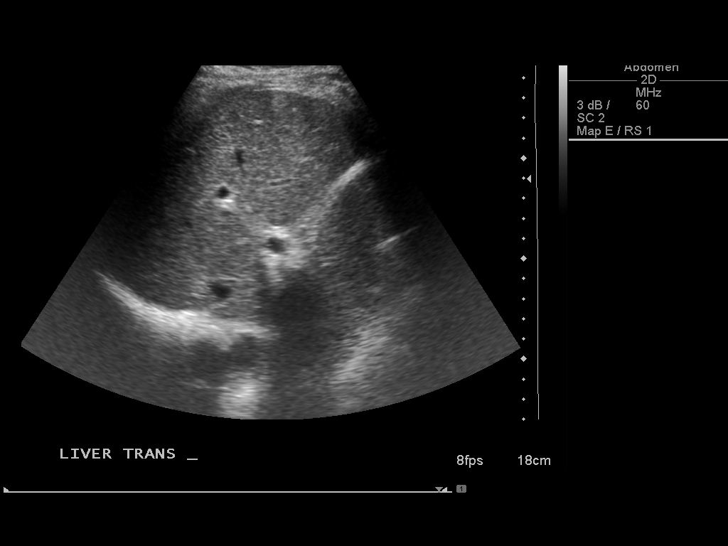
[im 73/176]
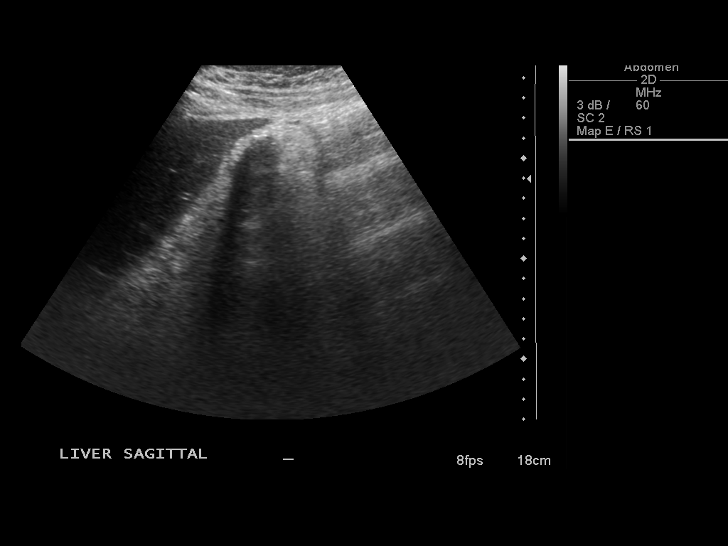
[im 88/176]
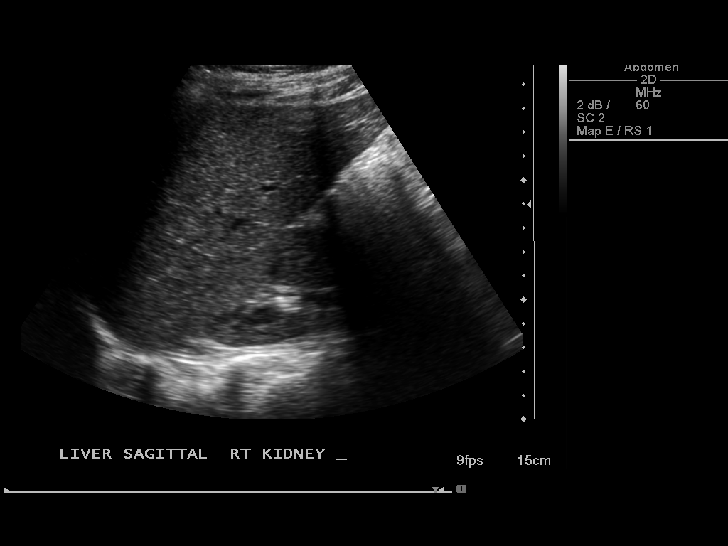
[im 103/176]
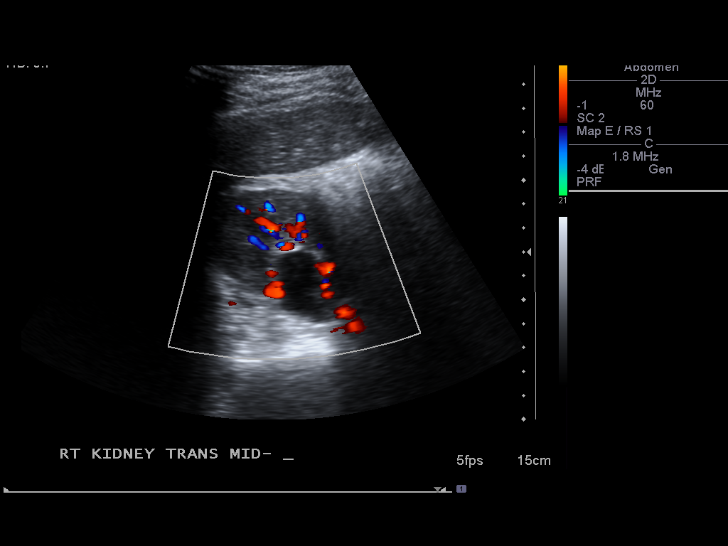
[im 117/176]
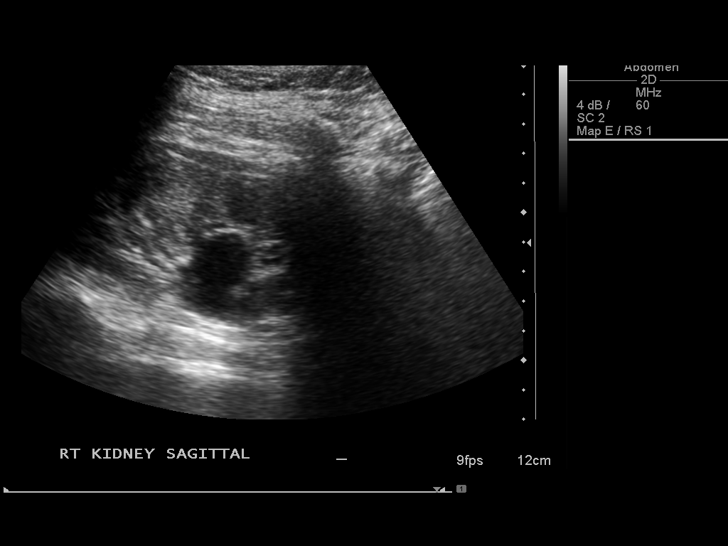
[im 132/176]
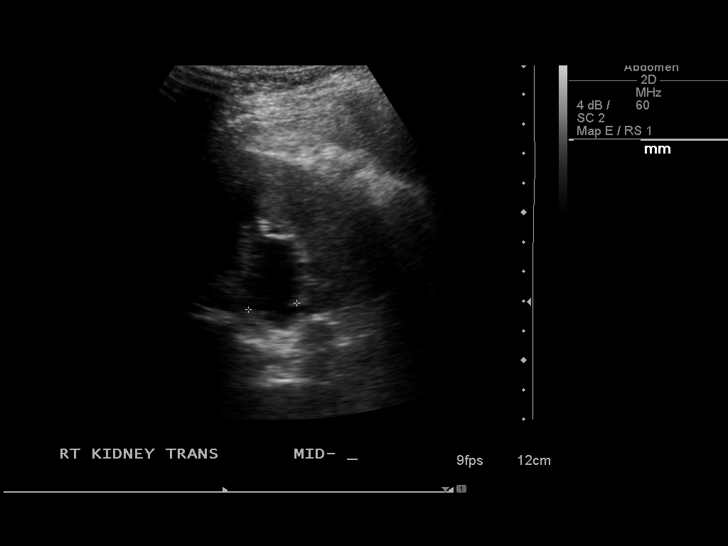
[im 146/176]
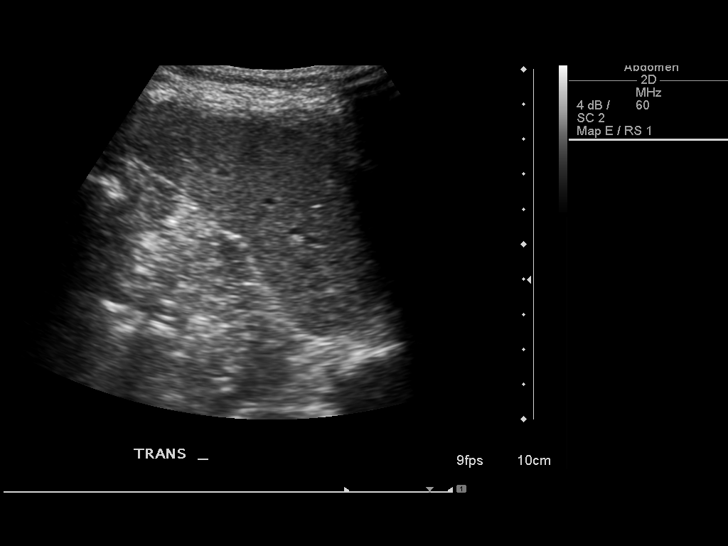
[im 161/176]
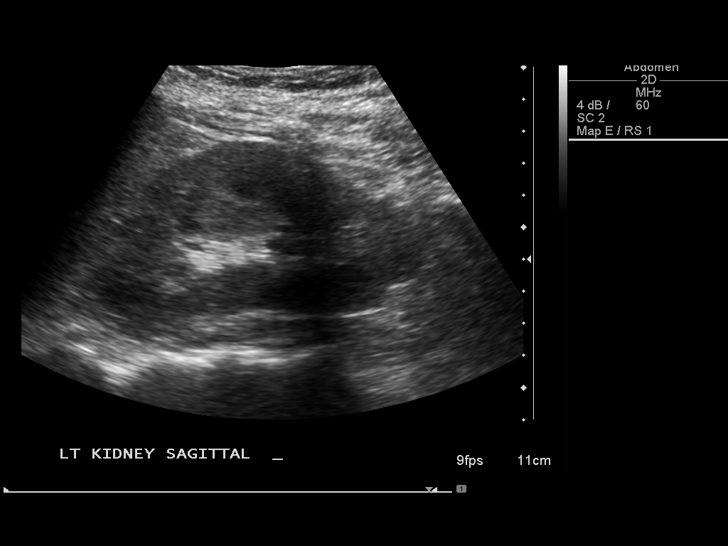
[im 176/176]
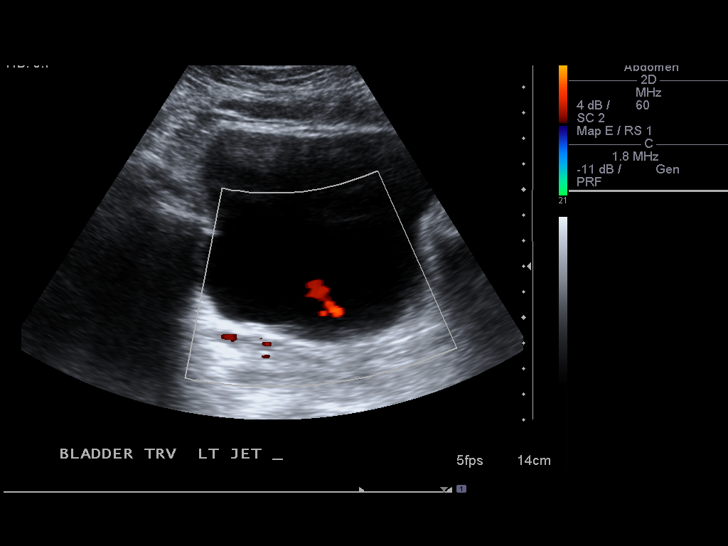

[13 of 25 positions shown; findings below may reference images not displayed]

FINDINGS: Gallbladder:  The gallbladder is normal in appearance, without
evidence for gallstones, significant gallbladder wall thickening or
pericholecystic fluid.  No ultrasonographic Murphy's sign is
elicited.

Common Bile Duct:  0.5 cm in diameter; within normal limits in
caliber.

Liver:  Normal parenchymal echogenicity and echotexture; no focal
lesions identified.  Limited Doppler evaluation demonstrates normal
blood flow within the liver.

IVC:  Unremarkable in appearance.

Pancreas:  Although the pancreas is difficult to visualize in its
entirety due to overlying bowel gas, no focal pancreatic
abnormality is identified.

Spleen:  8.1 cm in length; within normal limits in size and
echotexture.

Right kidney:  11.1 cm in length; normal in size, configuration and
parenchymal echogenicity.  No evidence of mass.  There is
prominence of the right renal pelvis, which may reflect mild
pelvicaliectasis or slight hydronephrosis.

Left kidney:  11.0 cm in length; normal in size, configuration and
parenchymal echogenicity.  No evidence of mass or hydronephrosis.

Abdominal Aorta:  Normal in caliber; no aneurysm identified.

Bilateral ureteral jets are noted at the bladder, slightly less
prominent on the right.
IMPRESSION: 1.  Prominence of the right renal pelvis, which may reflect mild
pelvicaliectasis or slight hydronephrosis.  Bilateral ureteral jets
are visualized, though mildly less prominent on the right.  This
could reflect slight hydronephrosis of pregnancy.
2.  Otherwise unremarkable abdominal ultrasound.

## 2015-02-01 LAB — OB RESULTS CONSOLE GBS: GBS: POSITIVE

## 2015-02-18 NOTE — L&D Delivery Note (Signed)
Delivery Note At 4:55 PM a viable and healthy female was delivered via Vaginal, Spontaneous Delivery (Presentation: Left Occiput Anterior).  APGAR: 8, 9; weight- pending  .   Placenta status: Intact, Spontaneous.  Cord: 3 vessels with the following complications: None.  Cord pH: N/A  Cord clamping after 1 minute. Meconium stained fluid but baby vigorous.   Anesthesia: Epidural  Episiotomy: None Lacerations: Vaginal Suture Repair: 3.0 vicryl rapide Est. Blood Loss (mL): 254   Mom to postpartum.  Baby to Couplet care / Skin to Skin.  Khambrel Amsden R 02/22/2015, 5:25 PM

## 2015-02-22 ENCOUNTER — Inpatient Hospital Stay (HOSPITAL_COMMUNITY): Payer: Commercial Managed Care - PPO | Admitting: Anesthesiology

## 2015-02-22 ENCOUNTER — Inpatient Hospital Stay (HOSPITAL_COMMUNITY)
Admission: AD | Admit: 2015-02-22 | Discharge: 2015-02-23 | DRG: 775 | Disposition: A | Discharge status: Home / Self Care | Payer: Commercial Managed Care - PPO | Source: Ambulatory Visit | Attending: Obstetrics & Gynecology | Admitting: Obstetrics & Gynecology

## 2015-02-22 ENCOUNTER — Encounter (HOSPITAL_COMMUNITY): Payer: Self-pay | Admitting: *Deleted

## 2015-02-22 DIAGNOSIS — O99824 Streptococcus B carrier state complicating childbirth: Secondary | ICD-10-CM | POA: Diagnosis present

## 2015-02-22 DIAGNOSIS — Z3A39 39 weeks gestation of pregnancy: Secondary | ICD-10-CM | POA: Diagnosis not present

## 2015-02-22 DIAGNOSIS — Z8249 Family history of ischemic heart disease and other diseases of the circulatory system: Secondary | ICD-10-CM | POA: Diagnosis not present

## 2015-02-22 DIAGNOSIS — IMO0001 Reserved for inherently not codable concepts without codable children: Secondary | ICD-10-CM

## 2015-02-22 LAB — CBC
HEMATOCRIT: 40 % (ref 36.0–46.0)
HEMOGLOBIN: 13.3 g/dL (ref 12.0–15.0)
MCH: 29.6 pg (ref 26.0–34.0)
MCHC: 33.3 g/dL (ref 30.0–36.0)
MCV: 89.1 fL (ref 78.0–100.0)
Platelets: 187 10*3/uL (ref 150–400)
RBC: 4.49 MIL/uL (ref 3.87–5.11)
RDW: 15 % (ref 11.5–15.5)
WBC: 8.6 10*3/uL (ref 4.0–10.5)

## 2015-02-22 LAB — RPR: RPR Ser Ql: NONREACTIVE

## 2015-02-22 LAB — TYPE AND SCREEN
ABO/RH(D): O POS
Antibody Screen: NEGATIVE

## 2015-02-22 LAB — OB RESULTS CONSOLE RPR: RPR: NONREACTIVE

## 2015-02-22 MED ORDER — LANOLIN HYDROUS EX OINT: TOPICAL_OINTMENT | CUTANEOUS | Status: DC | PRN

## 2015-02-22 MED ORDER — OXYTOCIN BOLUS FROM INFUSION
500.0000 mL | INTRAVENOUS | Status: DC
  Administered 2015-02-22: 500 mL via INTRAVENOUS

## 2015-02-22 MED ORDER — SIMETHICONE 80 MG PO CHEW: 80.0000 mg | CHEWABLE_TABLET | ORAL | Status: DC | PRN

## 2015-02-22 MED ORDER — CITRIC ACID-SODIUM CITRATE 334-500 MG/5ML PO SOLN: 30.0000 mL | ORAL | Status: DC | PRN

## 2015-02-22 MED ORDER — DEXTROSE 5 % IV SOLN
2.5000 10*6.[IU] | INTRAVENOUS | Status: DC
  Filled 2015-02-22 (×2): qty 2.5

## 2015-02-22 MED ORDER — LACTATED RINGERS IV SOLN
500.0000 mL | INTRAVENOUS | Status: DC | PRN
  Administered 2015-02-22: 500 mL via INTRAVENOUS

## 2015-02-22 MED ORDER — DIPHENHYDRAMINE HCL 50 MG/ML IJ SOLN: 12.5000 mg | INTRAMUSCULAR | Status: DC | PRN

## 2015-02-22 MED ORDER — OXYTOCIN 10 UNIT/ML IJ SOLN
2.5000 [IU]/h | INTRAVENOUS | Status: DC
  Filled 2015-02-22: qty 4

## 2015-02-22 MED ORDER — EPHEDRINE 5 MG/ML INJ
10.0000 mg | INTRAVENOUS | Status: DC | PRN
  Filled 2015-02-22: qty 2

## 2015-02-22 MED ORDER — SENNOSIDES-DOCUSATE SODIUM 8.6-50 MG PO TABS
2.0000 | ORAL_TABLET | ORAL | Status: DC
  Administered 2015-02-23: 2 via ORAL
  Filled 2015-02-22: qty 2

## 2015-02-22 MED ORDER — IBUPROFEN 600 MG PO TABS
600.0000 mg | ORAL_TABLET | Freq: Four times a day (QID) | ORAL | Status: DC
  Administered 2015-02-22 – 2015-02-23 (×5): 600 mg via ORAL
  Filled 2015-02-22 (×5): qty 1

## 2015-02-22 MED ORDER — OXYTOCIN 10 UNIT/ML IJ SOLN
1.0000 m[IU]/min | INTRAMUSCULAR | Status: DC
  Administered 2015-02-22: 2 m[IU]/min via INTRAVENOUS

## 2015-02-22 MED ORDER — LACTATED RINGERS IV SOLN
INTRAVENOUS | Status: DC
  Administered 2015-02-22: 13:00:00 via INTRAVENOUS

## 2015-02-22 MED ORDER — ACETAMINOPHEN 325 MG PO TABS: 650.0000 mg | ORAL_TABLET | ORAL | Status: DC | PRN

## 2015-02-22 MED ORDER — TERBUTALINE SULFATE 1 MG/ML IJ SOLN
0.2500 mg | Freq: Once | INTRAMUSCULAR | Status: DC | PRN
  Filled 2015-02-22: qty 1

## 2015-02-22 MED ORDER — TETANUS-DIPHTH-ACELL PERTUSSIS 5-2.5-18.5 LF-MCG/0.5 IM SUSP: 0.5000 mL | Freq: Once | INTRAMUSCULAR | Status: DC

## 2015-02-22 MED ORDER — FENTANYL 2.5 MCG/ML BUPIVACAINE 1/10 % EPIDURAL INFUSION (WH - ANES)
14.0000 mL/h | INTRAMUSCULAR | Status: DC | PRN
Start: 2015-02-22 — End: 2015-02-22
  Administered 2015-02-22: 14 mL/h via EPIDURAL
  Filled 2015-02-22: qty 125

## 2015-02-22 MED ORDER — WITCH HAZEL-GLYCERIN EX PADS: 1.0000 "application " | MEDICATED_PAD | CUTANEOUS | Status: DC | PRN

## 2015-02-22 MED ORDER — OXYCODONE-ACETAMINOPHEN 5-325 MG PO TABS: 2.0000 | ORAL_TABLET | ORAL | Status: DC | PRN

## 2015-02-22 MED ORDER — PHENYLEPHRINE 40 MCG/ML (10ML) SYRINGE FOR IV PUSH (FOR BLOOD PRESSURE SUPPORT)
80.0000 ug | PREFILLED_SYRINGE | INTRAVENOUS | Status: DC | PRN
  Filled 2015-02-22: qty 2
  Filled 2015-02-22: qty 20

## 2015-02-22 MED ORDER — ONDANSETRON HCL 4 MG PO TABS: 4.0000 mg | ORAL_TABLET | ORAL | Status: DC | PRN

## 2015-02-22 MED ORDER — PRENATAL MULTIVITAMIN CH
1.0000 | ORAL_TABLET | Freq: Every day | ORAL | Status: DC
  Administered 2015-02-23: 1 via ORAL
  Filled 2015-02-22: qty 1

## 2015-02-22 MED ORDER — DIPHENHYDRAMINE HCL 25 MG PO CAPS: 25.0000 mg | ORAL_CAPSULE | Freq: Four times a day (QID) | ORAL | Status: DC | PRN

## 2015-02-22 MED ORDER — BENZOCAINE-MENTHOL 20-0.5 % EX AERO
1.0000 "application " | INHALATION_SPRAY | CUTANEOUS | Status: DC | PRN
  Administered 2015-02-23: 1 via TOPICAL
  Filled 2015-02-22: qty 56

## 2015-02-22 MED ORDER — DIBUCAINE 1 % RE OINT: 1.0000 "application " | TOPICAL_OINTMENT | RECTAL | Status: DC | PRN

## 2015-02-22 MED ORDER — LIDOCAINE HCL (PF) 1 % IJ SOLN
30.0000 mL | INTRAMUSCULAR | Status: DC | PRN
  Filled 2015-02-22: qty 30

## 2015-02-22 MED ORDER — LIDOCAINE HCL (PF) 1 % IJ SOLN
INTRAMUSCULAR | Status: DC | PRN
  Administered 2015-02-22: 3 mL
  Administered 2015-02-22: 4 mL via EPIDURAL

## 2015-02-22 MED ORDER — SODIUM CHLORIDE 0.9 % IV SOLN
2.0000 g | Freq: Four times a day (QID) | INTRAVENOUS | Status: DC
  Administered 2015-02-22 (×2): 2 g via INTRAVENOUS
  Filled 2015-02-22 (×4): qty 2000

## 2015-02-22 MED ORDER — OXYCODONE-ACETAMINOPHEN 5-325 MG PO TABS: 1.0000 | ORAL_TABLET | ORAL | Status: DC | PRN

## 2015-02-22 MED ORDER — ONDANSETRON HCL 4 MG/2ML IJ SOLN
4.0000 mg | Freq: Four times a day (QID) | INTRAMUSCULAR | Status: DC | PRN
  Administered 2015-02-22: 4 mg via INTRAVENOUS
  Filled 2015-02-22: qty 2

## 2015-02-22 MED ORDER — PENICILLIN G POTASSIUM 5000000 UNITS IJ SOLR
5.0000 10*6.[IU] | Freq: Once | INTRAVENOUS | Status: DC
  Administered 2015-02-22: 5 10*6.[IU] via INTRAVENOUS
  Filled 2015-02-22: qty 5

## 2015-02-22 MED ORDER — ONDANSETRON HCL 4 MG/2ML IJ SOLN: 4.0000 mg | INTRAMUSCULAR | Status: DC | PRN

## 2015-02-22 MED ORDER — FLEET ENEMA 7-19 GM/118ML RE ENEM: 1.0000 | ENEMA | RECTAL | Status: DC | PRN

## 2015-02-22 MED ORDER — ZOLPIDEM TARTRATE 5 MG PO TABS: 5.0000 mg | ORAL_TABLET | Freq: Every evening | ORAL | Status: DC | PRN

## 2015-02-22 NOTE — MAU Note (Signed)
Pt presents to MAU with complaints of contractions since 3am. Denies any LOF or vaginal bleeding

## 2015-02-22 NOTE — Anesthesia Preprocedure Evaluation (Signed)
Anesthesia Evaluation  Patient identified by MRN, date of birth, ID band Patient awake    Reviewed: Allergy & Precautions, NPO status , Patient's Chart, lab work & pertinent test results  Airway Mallampati: I  TM Distance: >3 FB Neck ROM: Full    Dental  (+) Teeth Intact   Pulmonary    breath sounds clear to auscultation       Cardiovascular  Rhythm:Regular Rate:Normal     Neuro/Psych    GI/Hepatic   Endo/Other    Renal/GU      Musculoskeletal   Abdominal   Peds  Hematology   Anesthesia Other Findings   Reproductive/Obstetrics                             Anesthesia Physical Anesthesia Plan  ASA: II  Anesthesia Plan: Epidural   Post-op Pain Management:    Induction: Intravenous  Airway Management Planned: Natural Airway  Additional Equipment:   Intra-op Plan:   Post-operative Plan:   Informed Consent: I have reviewed the patients History and Physical, chart, labs and discussed the procedure including the risks, benefits and alternatives for the proposed anesthesia with the patient or authorized representative who has indicated his/her understanding and acceptance.     Plan Discussed with: CRNA, Surgeon and Anesthesiologist  Anesthesia Plan Comments:         Anesthesia Quick Evaluation

## 2015-02-22 NOTE — Progress Notes (Signed)
Subjective: Doing well, pain well controlled with epidural, pushing with good effort, needs more descent   Objective: BP 112/81 mmHg  Pulse 71  Temp(Src) 98 F (36.7 C) (Oral)  Resp 18  Ht 4\' 11"  (1.499 m)  Wt 137 lb (62.143 kg)  BMI 27.66 kg/m2  SpO2 100%  LMP 02/06/2014 (Approximate)   FHT:  FHR: 135 bpm, variability: moderate,  accelerations:  Present,  decelerations:  Present variable decels with pushing and good return to baseline UC:   regular, every 3-4 minutes on pitocin started for protracted labor SVE:  Complete, +1, OA  Assessment / Plan: Augmentation of labor, progressing well  Fetal Wellbeing:  Category I Pain Control:  Epidural  Anticipated MOD:  NSVD  Barbara Campbell R 02/22/2015, 3:28 PM

## 2015-02-22 NOTE — H&P (Signed)
Barbara Campbell is a 35 y.o. female G3P1011 2453w0d presenting for spontaneous labor.  Previous FT SVD.  OB History    Gravida Para Term Preterm AB TAB SAB Ectopic Multiple Living   3 1 1  1 1    1      Past Medical History  Diagnosis Date  . Allergy    History reviewed. No pertinent past surgical history. Family History: family history includes Anemia in her mother; Hyperlipidemia in her mother; Hypertension in her father. Social History:  reports that she has never smoked. She has never used smokeless tobacco. She reports that she drinks alcohol. She reports that she does not use illicit drugs.  Allergies  Allergen Reactions  . Sulfa Antibiotics Rash    Dilation: 4 Effacement (%): 90 Station: -2 Exam by:: Ginger Morris RN    Blood pressure 151/81, pulse 85, temperature 98.2 F (36.8 C), temperature source Oral, resp. rate 18, height 4\' 11"  (1.499 m), weight 137 lb (62.143 kg), last menstrual period 02/06/2014. Exam Physical Exam   FHR 120's with good variability, accelerations present, no deceleration UCs q2-3 min  HPP:  Patient Active Problem List   Diagnosis Date Noted  . Active labor 02/22/2015  . Postpartum care following vavd (7/15) 09/01/2012  . AR (allergic rhinitis) 05/22/2012    Prenatal labs: ABO, Rh: --/--/O POS (01/05 16100905) Antibody: NEG (01/05 0905) Rubella: !Error! RPR: Nonreactive (01/05 [id number])  HBsAg: Negative (06/07 0000)  HIV: Non-reactive (06/07 0000)  Genetic testing: Quad neg US anato: wnl 1 hr GTT: abnormal.  3 hr GTT 1 abnormal value GBS: Positive (12/15 0000)   Assessment/Plan: G3P1A1 39 wks in active labor.  GBS pos on Pen G.  Just received an epidural.  FHR Cat 1.  Expectant management towards vaginal delivery.   Ammy Lienhard,MARIE-LYNE 02/22/2015, 10:19 AM

## 2015-02-22 NOTE — Progress Notes (Signed)
Subjective: Doing well, pain controled with epidural, UCs q3 min  Anesthesia epidural   Objective: BP 113/79 mmHg  Pulse 76  Temp(Src) 98.2 F (36.8 C) (Oral)  Resp 16  Ht 4\' 11"  (1.499 m)  Wt 137 lb (62.143 kg)  BMI 27.66 kg/m2  SpO2 100%  LMP 02/06/2014 (Approximate)   FHT:  FHR: 120's bpm, variability: moderate,  accelerations:  Present,  decelerations:  Absent UC:   regular, every 3 minutes VE:   Dilation: 6 Effacement (%): 100 Station: -1, 0 Exam by:: Dr.Darenda Fike  Membranes intact   Assessment / Plan: Spontaneous labor, progressing normally.  GBS pos on 1st dose of Pen G.  Decision to add Ampicillin 2 g IV because of anticipated rapid labor.  Fetal Wellbeing:  Category I Pain Control:  Epidural  Anticipated MOD:  NSVD  Barbara Campbell,Barbara Campbell 02/22/2015, 10:32 AM

## 2015-02-22 NOTE — Anesthesia Procedure Notes (Signed)
Epidural Patient location during procedure: OB  Staffing Anesthesiologist: Biance Moncrief  Preanesthetic Checklist Completed: patient identified, site marked, surgical consent, pre-op evaluation, timeout performed, IV checked, risks and benefits discussed and monitors and equipment checked  Epidural Patient position: sitting Prep: site prepped and draped and DuraPrep Patient monitoring: continuous pulse ox and blood pressure Approach: midline Location: L3-L4 Injection technique: LOR air  Needle:  Needle type: Tuohy  Needle gauge: 17 G Needle length: 9 cm and 9 Needle insertion depth: 4 cm Catheter type: closed end flexible Catheter size: 19 Gauge Catheter at skin depth: 10 cm Test dose: negative  Assessment Events: blood not aspirated, injection not painful, no injection resistance, negative IV test and no paresthesia

## 2015-02-22 NOTE — Progress Notes (Signed)
Barbara Campbell is a 35 y.o. G3P1011 at 9512w0d by LMP admitted for active labor  Subjective: occ pressure  Objective: BP 113/79 mmHg  Pulse 69  Temp(Src) 98 F (36.7 C) (Oral)  Resp 16  Ht 4\' 11"  (1.499 m)  Wt 62.143 kg (137 lb)  BMI 27.66 kg/m2  SpO2 100%  LMP 02/06/2014 (Approximate)      FHT:  FHR: 125 bpm, variability: moderate,  accelerations:  Present,  decelerations:  Absent UC:   irregular, every 5-7 minutes SVE:   Dilation: 10 Effacement (%): 100 Station: -1, 0 Exam by:: Dr.Leovardo Thoman  Labs: Lab Results  Component Value Date   WBC 8.6 02/22/2015   HGB 13.3 02/22/2015   HCT 40.0 02/22/2015   MCV 89.1 02/22/2015   PLT 187 02/22/2015    Assessment / Plan: Protracted active phase  Labor: start pitocin Preeclampsia:  no signs or symptoms of toxicity Fetal Wellbeing:  Category I Pain Control:  Epidural I/D:  n/a Anticipated MOD:  NSVD  Keola Heninger J 02/22/2015, 1:55 PM

## 2015-02-23 ENCOUNTER — Encounter (HOSPITAL_COMMUNITY): Payer: Self-pay | Admitting: Obstetrics and Gynecology

## 2015-02-23 LAB — CBC
HEMATOCRIT: 33.4 % — AB (ref 36.0–46.0)
Hemoglobin: 11 g/dL — ABNORMAL LOW (ref 12.0–15.0)
MCH: 29.6 pg (ref 26.0–34.0)
MCHC: 32.9 g/dL (ref 30.0–36.0)
MCV: 89.8 fL (ref 78.0–100.0)
PLATELETS: 185 10*3/uL (ref 150–400)
RBC: 3.72 MIL/uL — ABNORMAL LOW (ref 3.87–5.11)
RDW: 15.1 % (ref 11.5–15.5)
WBC: 13.2 10*3/uL — AB (ref 4.0–10.5)

## 2015-02-23 MED ORDER — IBUPROFEN 600 MG PO TABS: 600.0000 mg | ORAL_TABLET | Freq: Four times a day (QID) | ORAL | Status: AC

## 2015-02-23 NOTE — Progress Notes (Signed)
PPD #1- SVD  Subjective:   Reports feeling well, desires early discharge Tolerating po/ No nausea or vomiting Bleeding is light Pain controlled with Motrin Up ad lib / ambulatory / voiding without problems Newborn: breastfeeding  / Circumcision: planning  Objective:   VS: VS:  Filed Vitals:   02/22/15 1955 02/22/15 2108 02/23/15 0137 02/23/15 0900  BP: 131/83 126/80 120/70 116/63  Pulse: 74 76 87 73  Temp: 98.3 F (36.8 C) 98.5 F (36.9 C) 98.5 F (36.9 C) 98.2 F (36.8 C)  TempSrc: Oral Oral Oral Oral  Resp: 18 18 18 16   Height:      Weight:      SpO2: 100% 100% 96%     LABS:  Recent Labs  02/22/15 0905 02/23/15 0510  WBC 8.6 13.2*  HGB 13.3 11.0*  PLT 187 185   Blood type: --/--/O POS (01/05 0905) Rubella: Immune (06/07 0000)                I&O: Intake/Output      01/05 0701 - 01/06 0700 01/06 0701 - 01/07 0700   Urine (mL/kg/hr) 725    Blood 294    Total Output 1019     Net -1019            Physical Exam: Alert and oriented X3 Abdomen: soft, non-tender, non-distended  Fundus: firm, non-tender, U-2 Perineum: Well approximated, no significant erythema, edema, or drainage; healing well. Lochia: small Extremities: No edema, no calf pain or tenderness   Assessment: PPD #1  G3P2012/ S/P:spontaneous vaginal Doing well - stable for discharge home  Plan: Discharge home RX's:  Ibuprofen 600mg  po Q 6 hrs prn pain #30 Refill x 0 Follow up in 6 wks at [facility] for postpartum visit [facility] booklet given    Donette LarryBHAMBRI, Tyrez Berrios, N MSN, CNM 02/23/2015, 10:07 AM

## 2015-02-23 NOTE — Lactation Note (Signed)
This note was copied from the chart of Barbara [place]. Lactation Consultation Note Experienced BF for 6 1/2 months to her 2 1/34 yr old daughter. Daughter had difficulty the first couple of weeks of life latching. Mom has to supplement. Then at 256 1/2 months old moms milk dried up. Mom had requested formula earlier in the night d/t baby wouldn't latch or BF. Baby wasn't interested in bottle feeding so didn't. Assisted mom in latching in football position and latching. Baby had voided in bed, and in diaper along w/large stool. Baby latched well. Demonstrated breast massage. Mom did teach back for instructions. Questions answered. Mom has hand pump at bedside. Mom has good everted nipples, hand expression demonstrated thick colostrum.  Mom encouraged to feed baby 8-12 times/24 hours and with feeding cues. Educated of newborn behavior, I&O,cluster feeding, supply and demand. WH/LC brochure given w/resources, support groups and LC services. eviewed Baby & Me book's Breastfeeding Basics. Reviewed Baby & Me book's Breastfeeding Basics.  Patient Name: Barbara Campbell ZOXWR'UToday's Date: 02/23/2015 Reason for consult: Initial assessment   Maternal Data Has patient been taught Hand Expression?: Yes Does the patient have breastfeeding experience prior to this delivery?: Yes  Feeding Feeding Type: Breast Fed Nipple Type: Slow - flow Length of feed: 10 min (still BF)  LATCH Score/Interventions Latch: Repeated attempts needed to sustain latch, nipple held in mouth throughout feeding, stimulation needed to elicit sucking reflex. Intervention(s): Adjust position;Assist with latch;Breast massage;Breast compression  Audible Swallowing: A few with stimulation Intervention(s): Skin to skin;Hand expression;Alternate breast massage  Type of Nipple: Everted at rest and after stimulation  Comfort (Breast/Nipple): Filling, red/small blisters or bruises, mild/mod discomfort  Problem noted: Mild/Moderate  discomfort Interventions (Mild/moderate discomfort): Comfort gels;Post-pump;Hand massage;Hand expression  Hold (Positioning): Assistance needed to correctly position infant at breast and maintain latch. Intervention(s): Breastfeeding basics reviewed;Support Pillows;Position options;Skin to skin  LATCH Score: 6  Lactation Tools Discussed/Used Tools: Pump;Comfort gels Breast pump type: Manual Pump Review: Setup, frequency, and cleaning;Milk Storage Initiated by:: RN Date initiated:: 02/22/15   Consult Status Consult Status: Follow-up Date: 02/23/15 Follow-up type: In-patient    Kasper Mudrick, Diamond NickelLAURA G 02/23/2015, 6:04 AM

## 2015-02-23 NOTE — Discharge Summary (Signed)
Obstetric Discharge Summary Reason for Admission: onset of labor and [redacted] weeks gestation Prenatal Course: uncomplicated GBS positive Intrapartum Procedures: spontaneous vaginal delivery, GBS prophylaxis and epidural Postpartum Procedures: none Complications-Operative and Postpartum: vaginal laceration HEMOGLOBIN  Date Value Ref Range Status  02/23/2015 11.0* 12.0 - 15.0 g/dL Final  78/29/562106/08/2014 30.812.8 g/dL Final   HCT  Date Value Ref Range Status  02/23/2015 33.4* 36.0 - 46.0 % Final  07/25/2014 37 % Final    Physical Exam:  General: alert, cooperative and no distress Lochia: appropriate Uterine Fundus: firm Incision: healing well, no significant drainage, no dehiscence, no significant erythema DVT Evaluation: No evidence of DVT seen on physical exam. Negative Homan's sign. No cords or calf tenderness. No significant calf/ankle edema.  Discharge Diagnoses: Term Pregnancy-delivered  Discharge Information: Date: 02/23/2015 Activity: pelvic rest Diet: routine Medications: PNV and Ibuprofen Condition: stable Instructions: refer to practice specific booklet Discharge to: home Follow-up Information    Follow up with MODY,VAISHALI R, MD. Schedule an appointment as soon as possible for a visit in 6 weeks.   Specialty:  Obstetrics and Gynecology   Contact information:   [address] [place] [address] [phone]       Newborn Data: Live born female on 02/22/15 Birth Weight: 7 lb 12.9 oz (3541 g) APGAR: 8, 9  Home with mother.  Keymon Mcelroy, N 02/23/2015, 10:09 AM

## 2015-02-23 NOTE — Anesthesia Postprocedure Evaluation (Signed)
Anesthesia Post Note  Patient: Barbara Campbell  Procedure(s) Performed: * No procedures listed *  Patient location during evaluation: Mother Baby Anesthesia Type: Epidural Level of consciousness: awake and alert and oriented Pain management: pain level controlled Vital Signs Assessment: post-procedure vital signs reviewed and stable Respiratory status: spontaneous breathing and respiratory function stable Cardiovascular status: blood pressure returned to baseline Postop Assessment: no headache, no backache, patient able to bend at knees, no signs of nausea or vomiting and adequate PO intake Anesthetic complications: no    Last Vitals:  Filed Vitals:   02/22/15 2108 02/23/15 0137  BP: 126/80 120/70  Pulse: 76 87  Temp: 36.9 C 36.9 C  Resp: 18 18    Last Pain:  Filed Vitals:   02/23/15 0647  PainSc: 3                  Sharaine Delange

## 2015-02-23 NOTE — Addendum Note (Signed)
Addendum  created 02/23/15 0846 by Junious SilkMelinda Undray Allman, CRNA   Modules edited: Clinical Notes   Clinical Notes:  File: [id number]

## 2015-03-01 ENCOUNTER — Inpatient Hospital Stay (HOSPITAL_COMMUNITY): Admission: RE | Admit: 2015-03-01 | Payer: Commercial Managed Care - PPO | Source: Ambulatory Visit
